# Patient Record
Sex: Female | Born: 2000 | Hispanic: Yes | Marital: Married | Smoking: Never smoker
Health system: Southern US, Community
[De-identification: ages and names within clinical notes are randomized; demographics above are authoritative.]

---
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
## 2020-02-06 ENCOUNTER — Other Ambulatory Visit: Payer: Self-pay | Admitting: Physician Assistant

## 2020-02-06 DIAGNOSIS — Z3493 Encounter for supervision of normal pregnancy, unspecified, third trimester: Secondary | ICD-10-CM

## 2020-02-06 DIAGNOSIS — R6251 Failure to thrive (child): Secondary | ICD-10-CM

## 2020-02-06 DIAGNOSIS — Z3483 Encounter for supervision of other normal pregnancy, third trimester: Secondary | ICD-10-CM

## 2020-02-06 DIAGNOSIS — O2613 Low weight gain in pregnancy, third trimester: Secondary | ICD-10-CM

## 2020-02-12 ENCOUNTER — Ambulatory Visit: Admission: RE | Admit: 2020-02-12 | Payer: Self-pay | Source: Ambulatory Visit

## 2020-02-17 ENCOUNTER — Ambulatory Visit: Admission: RE | Admit: 2020-02-17 | Payer: Self-pay | Source: Ambulatory Visit

## 2020-06-30 IMAGING — US US OB COMP LESS 14 WK
1 series · 14 of 28 positions shown · non-contrast
Comparison: None.

CLINICAL DATA: Pelvic pain

EXAM:
OBSTETRIC <14 WK ULTRASOUND
TECHNIQUE: Transabdominal ultrasound was performed for evaluation of the
gestation as well as the maternal uterus and adnexal regions.

[Series 2: us ob comp less 14 wk · 14 of 32 slices shown]
[im 2/32]
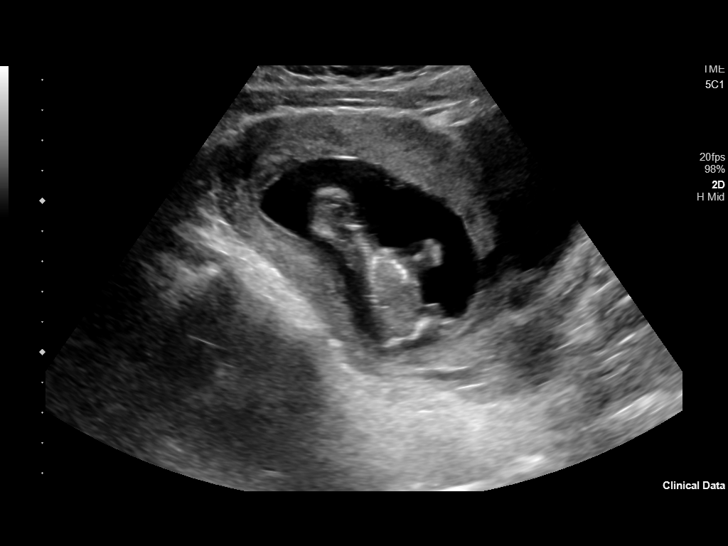
[im 4/32]
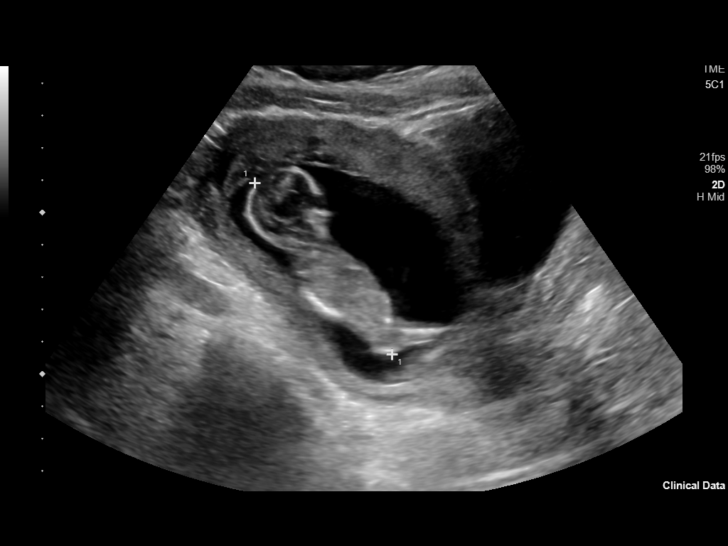
[im 6/32]
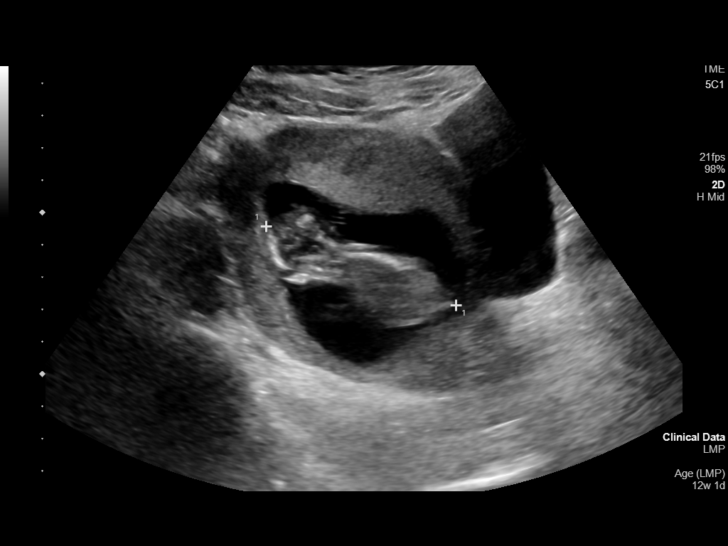
[im 9/32]
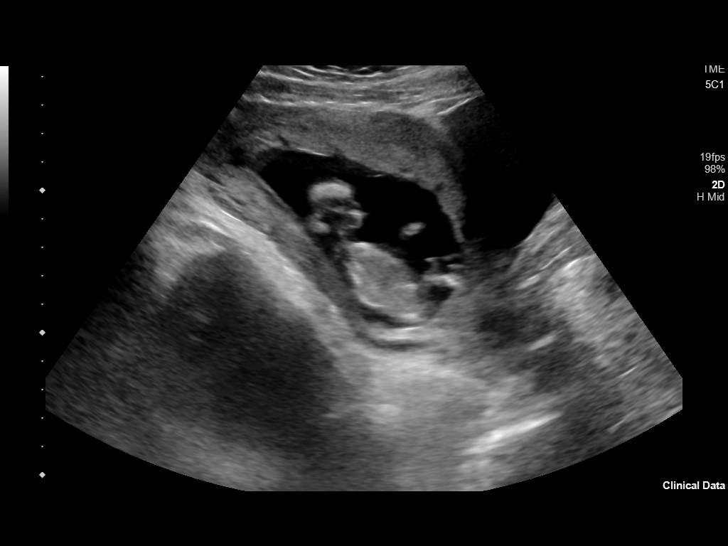
[im 11/32]
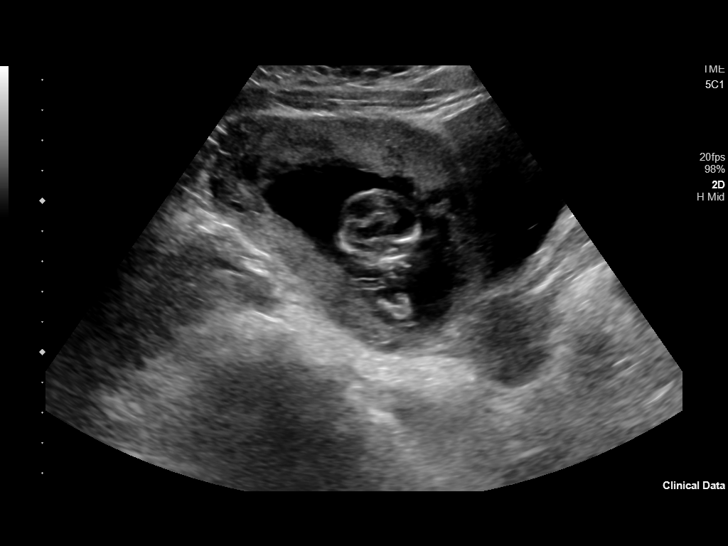
[im 13/32]
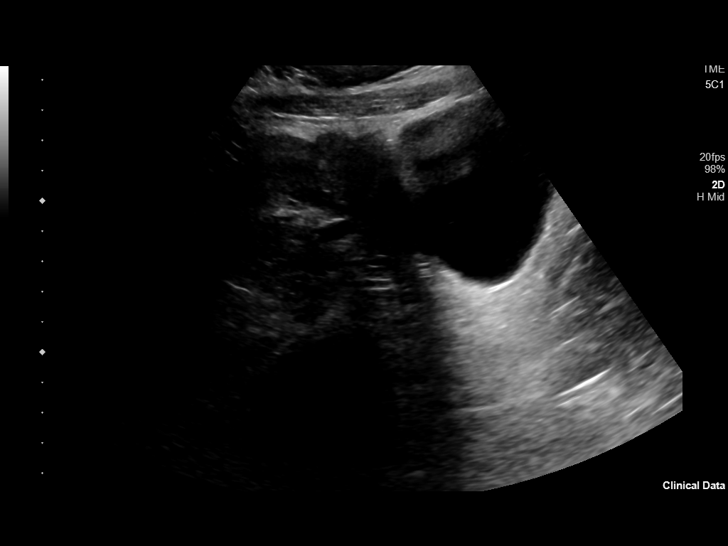
[im 15/32]
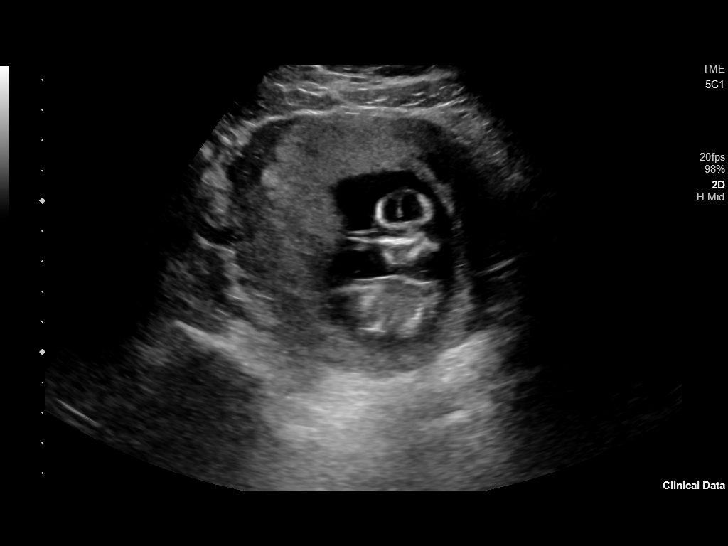
[im 18/32]
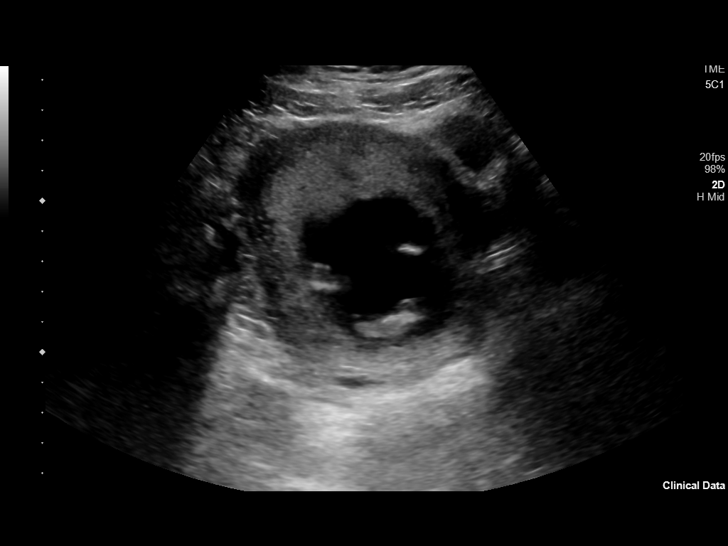
[im 20/32]
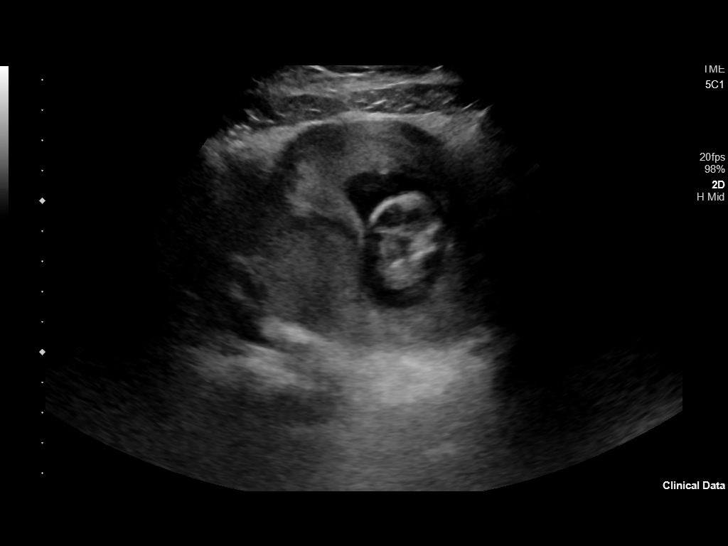
[im 22/32]
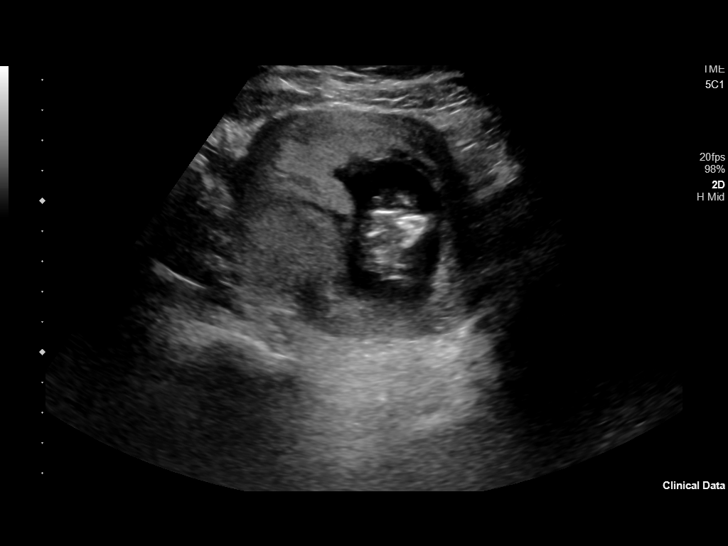
[im 25/32]
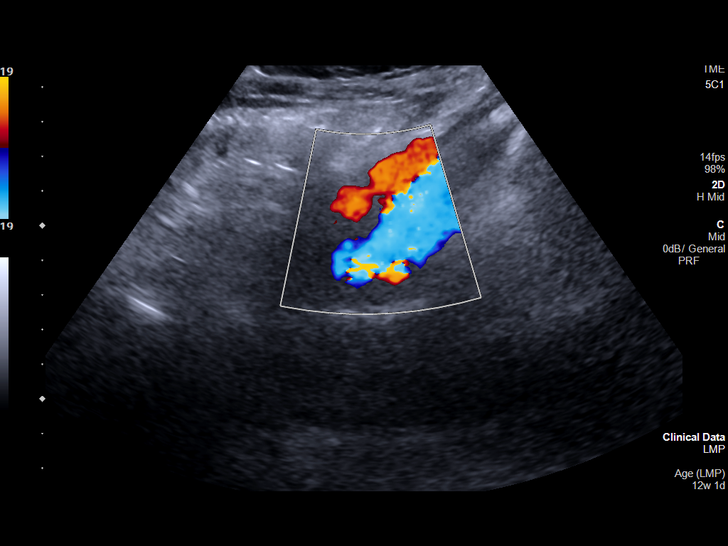
[im 27/32]
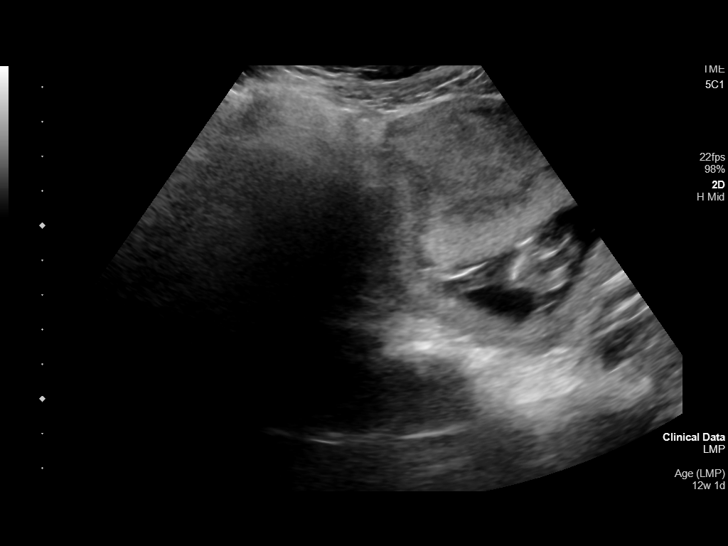
[im 29/32]
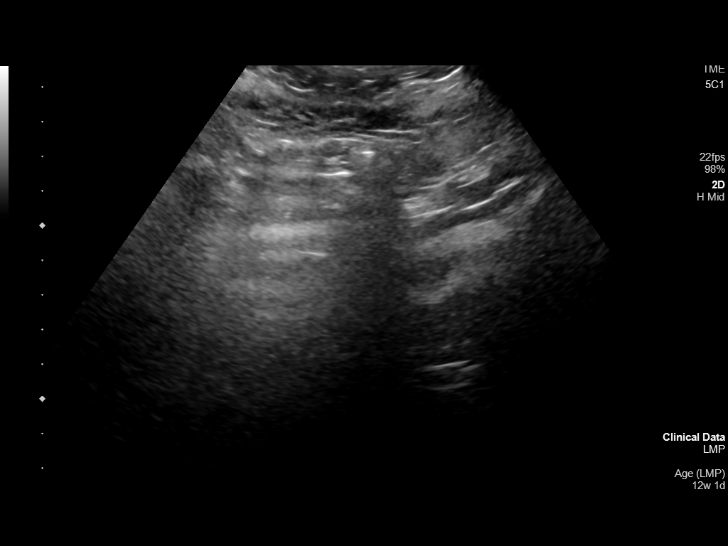
[im 32/32]
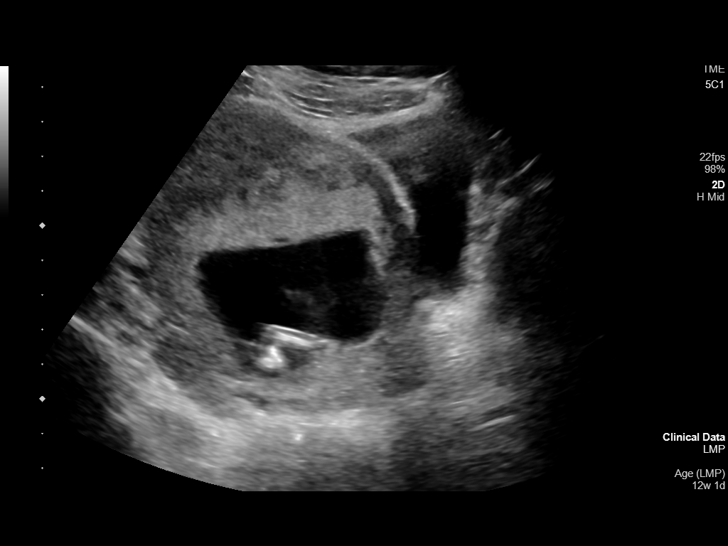

[14 of 28 positions shown; findings below may reference images not displayed]

FINDINGS: Intrauterine gestational sac: Visualized

Yolk sac:  Not visualized

Embryo:  Visualized

Cardiac Activity: Visualized

Heart Rate: 140 bpm

CRL:   66 mm   12 w 6 d                  US EDC: March 27, 2020

Subchorionic hemorrhage:  None visualized.

Maternal uterus/adnexae: Cervical os is closed. Neither ovary is
appreciable due to overlying gas. No extrauterine pelvic mass is
evident. No free pelvic fluid.
IMPRESSION: Single live intrauterine gestation with estimated gestational age of
approximately 13 weeks. No subchorionic hemorrhage evident. Note
that neither ovary is appreciable due to overlying gas. No free
pelvic fluid.

## 2020-06-30 IMAGING — US US RENAL
1 series · 14 of 25 positions shown · non-contrast
Comparison: None.

CLINICAL DATA: Abdominal pain, pregnancy

EXAM:
RENAL / URINARY TRACT ULTRASOUND COMPLETE

[Series 1: us renal · 14 of 34 slices shown]
[im 1/34]
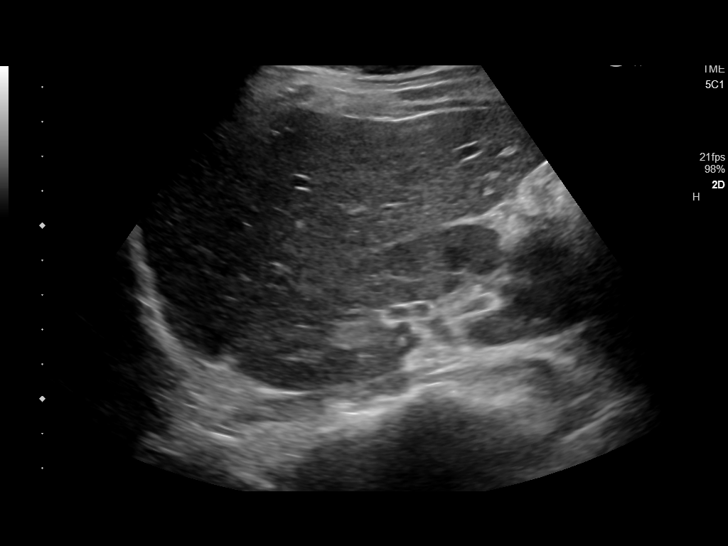
[im 3/34]
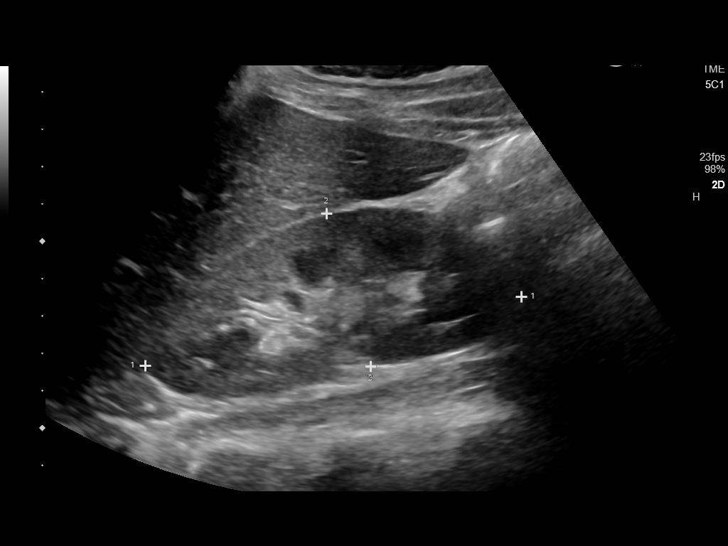
[im 6/34]
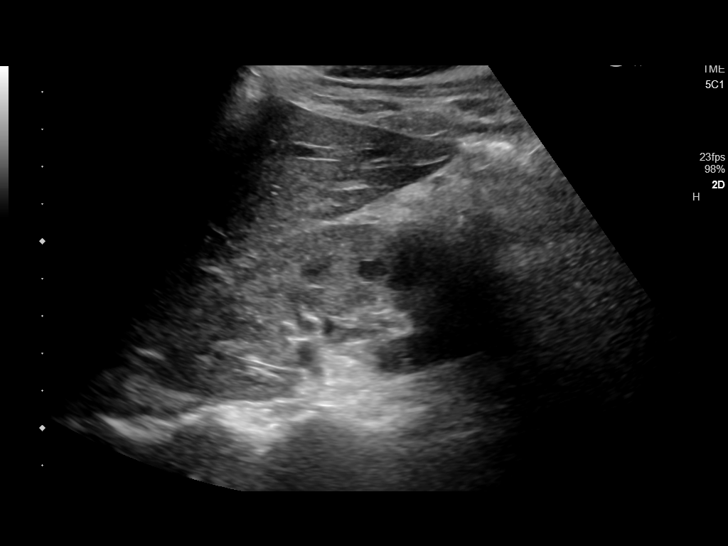
[im 9/34]
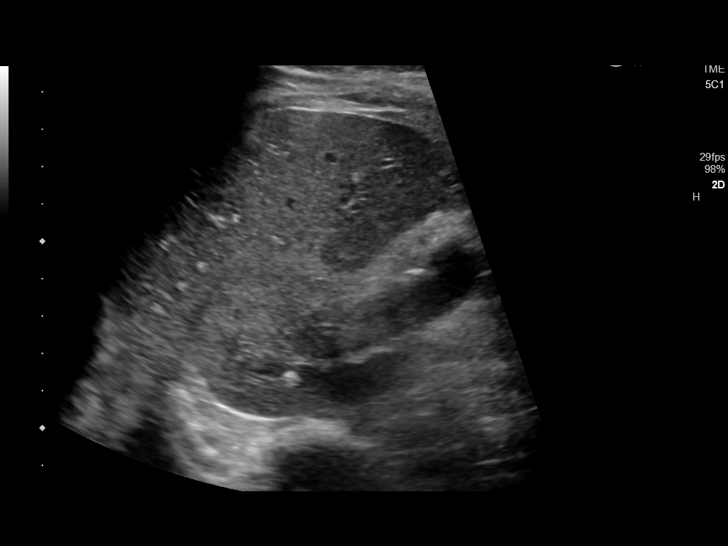
[im 12/34]
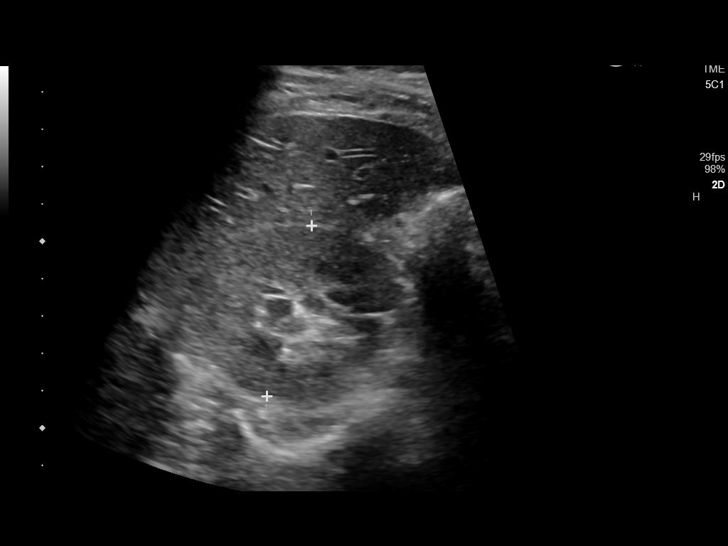
[im 13/34]
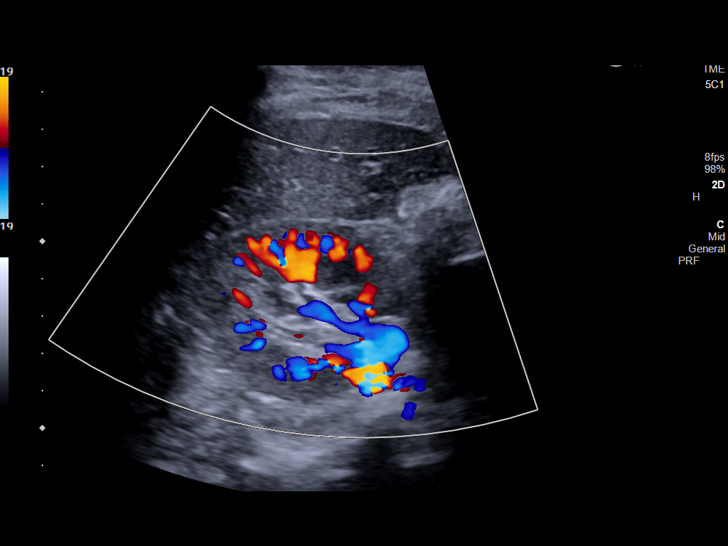
[im 16/34]
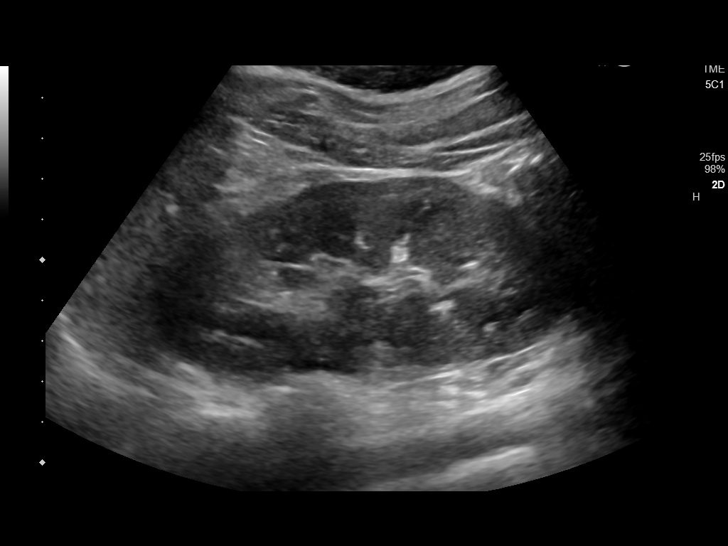
[im 18/34]
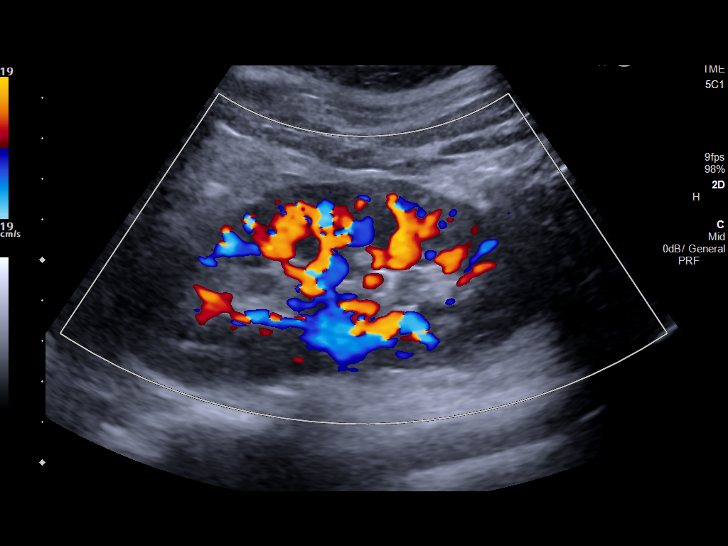
[im 21/34]
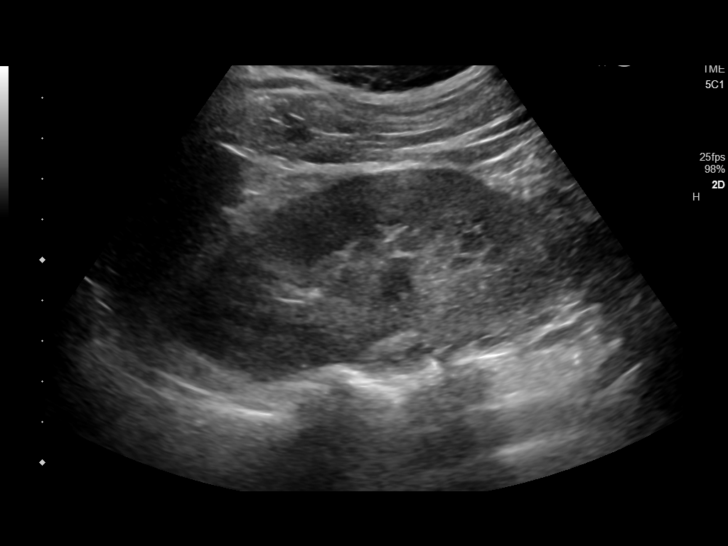
[im 23/34]
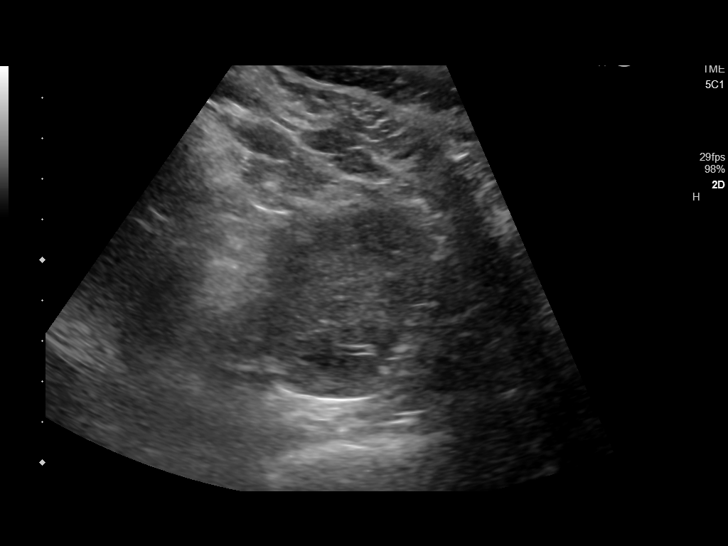
[im 25/34]
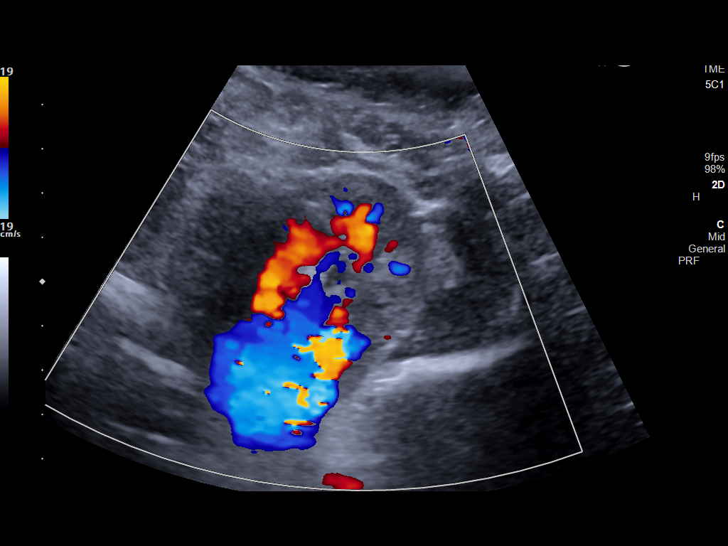
[im 28/34]
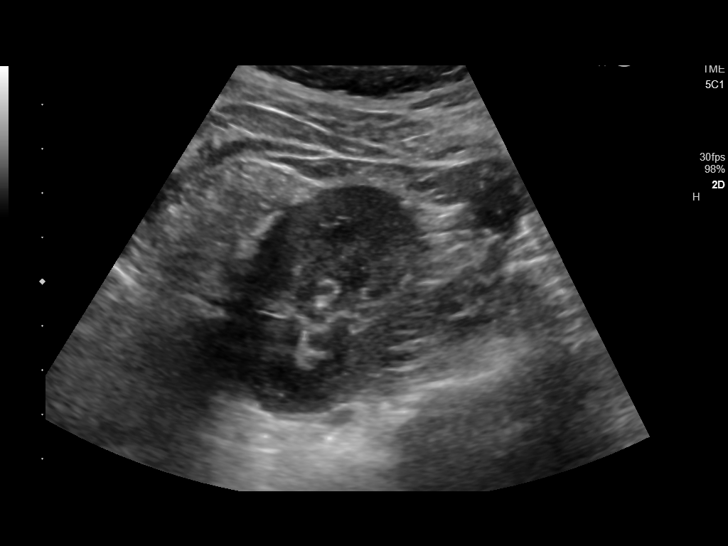
[im 31/34]
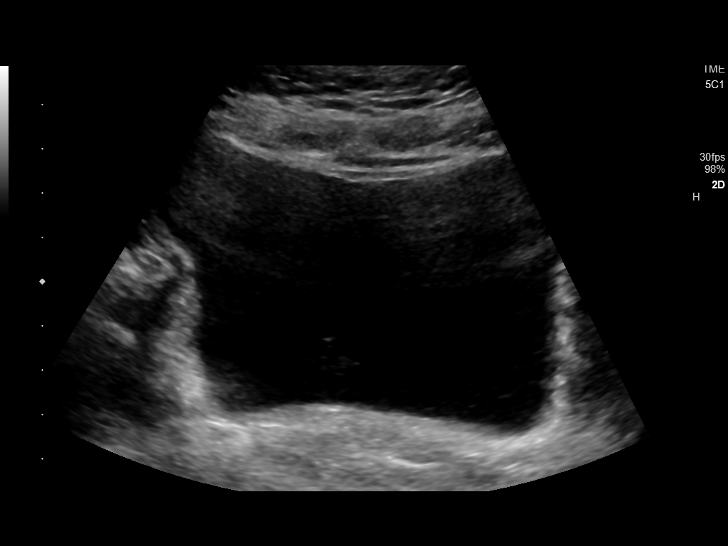
[im 34/34]
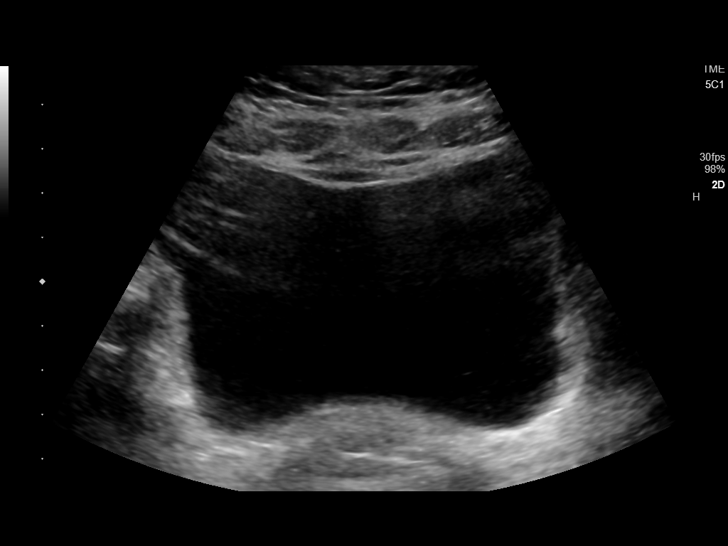

[14 of 25 positions shown; findings below may reference images not displayed]

FINDINGS: Right Kidney:

Renal measurements: 10.2 x 4.3 x 4.7 cm = volume: 107. mL .
Echogenicity within normal limits. No mass or hydronephrosis
visualized.

Left Kidney:

Renal measurements: 10.7 x 4.8 x 4.6 cm = volume: 120.9 mL.
Echogenicity within normal limits. No mass or hydronephrosis
visualized.

Bladder:

Appears normal for degree of bladder distention.

Other:

None
IMPRESSION: Negative renal ultrasound
